# Patient Record
Sex: Male | Born: 2000 | Race: White | Hispanic: No | Marital: Single | Smoking: Never smoker
Health system: Southern US, Community
[De-identification: ages and names within clinical notes are randomized; demographics above are authoritative.]

## PROBLEM LIST (undated history)

## (undated) DIAGNOSIS — H539 Unspecified visual disturbance: Secondary | ICD-10-CM

## (undated) DIAGNOSIS — F909 Attention-deficit hyperactivity disorder, unspecified type: Secondary | ICD-10-CM

## (undated) DIAGNOSIS — J45909 Unspecified asthma, uncomplicated: Secondary | ICD-10-CM

## (undated) HISTORY — PX: NO PAST SURGERIES: SHX2092

---
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
## 2022-05-08 ENCOUNTER — Other Ambulatory Visit (HOSPITAL_COMMUNITY): Payer: Self-pay

## 2022-05-08 MED ORDER — LISDEXAMFETAMINE DIMESYLATE 20 MG PO CAPS
20.0000 mg | ORAL_CAPSULE | Freq: Every day | ORAL | 0 refills | Status: AC
  Filled 2022-05-08: qty 30, 30d supply, fill #0

## 2022-11-28 ENCOUNTER — Encounter (INDEPENDENT_AMBULATORY_CARE_PROVIDER_SITE_OTHER): Payer: Self-pay

## 2022-11-28 ENCOUNTER — Ambulatory Visit (INDEPENDENT_AMBULATORY_CARE_PROVIDER_SITE_OTHER): Payer: 59 | Admitting: Otolaryngology

## 2022-11-28 VITALS — Ht 70.0 in | Wt 180.0 lb

## 2022-11-28 DIAGNOSIS — J338 Other polyp of sinus: Secondary | ICD-10-CM | POA: Diagnosis not present

## 2022-11-28 DIAGNOSIS — J324 Chronic pansinusitis: Secondary | ICD-10-CM

## 2022-11-30 DIAGNOSIS — J324 Chronic pansinusitis: Secondary | ICD-10-CM | POA: Insufficient documentation

## 2022-11-30 DIAGNOSIS — J338 Other polyp of sinus: Secondary | ICD-10-CM | POA: Insufficient documentation

## 2022-11-30 NOTE — Progress Notes (Unsigned)
Patient ID: Manuel Ingram, male   DOB: 2001-01-01, 22 y.o.   MRN: [id number]  Follow up: Chronic rhinosinusitis and polyposis  HPI: The patient is a 22 year old male who returns today for his follow-up evaluation. The patient has a history of bilateral chronic rhinosinusitis and polyposis. He underwent bilateral endoscopic sinus surgery in 12/2018.  He was subsequently treated with Dupixent.  The patient noted significant improvement in his sinonasal polyps after treatment with Dupixent. The patient stopped using Dupixent in the middle of 2023.  Since stopping Dupixent, he has noted increasing nasal congestion, loss of sense of smell, and recurrent sinusitis.  He has also noted frontal headaches.  He denies any purulent drainage.   Exam: General: Communicates without difficulty, well nourished, no acute distress. Head: Normocephalic, no evidence injury, no tenderness, facial buttresses intact without stepoff. Face/sinus: No tenderness to palpation and percussion. Facial movement is normal and symmetric. Eyes: PERRL, EOMI. No scleral icterus, conjunctivae clear. Neuro: CN II exam reveals vision grossly intact.  No nystagmus at any point of gaze. Ears: Auricles well formed without lesions.  Ear canals are intact without mass or lesion.  No erythema or edema is appreciated.  The TMs are intact without fluid. Nose: External evaluation reveals normal support and skin without lesions.  Dorsum is intact.  Anterior rhinoscopy reveals congested mucosa over anterior aspect of inferior turbinates and intact septum.  Polypoid tissue is noted bilaterally.  Oral:  Oral cavity and oropharynx are intact, symmetric, without erythema or edema.  Mucosa is moist without lesions. Neck: Full range of motion without pain.  There is no significant lymphadenopathy.  No masses palpable.  Thyroid bed within normal limits to palpation.  Parotid glands and submandibular glands equal bilaterally without mass.  Trachea is midline. Neuro:   CN 2-12 grossly intact.    Assessment: 1.  Bilateral chronic rhinosinusitis, with bilateral recurrent sinonasal polyps. 2.  Both nasal cavities are significantly obstructed by the recurrent polyposis. 3.  His septum and turbinates are well-healed from his previous surgery.  Plan: 1.  The physical exam findings are reviewed with the patient and his mother. 2.  The patient was recently treated with systemic steroid without improvement in his symptoms. 3.  Based on the above findings, the patient is strongly encouraged to restart the Dupixent injections.  The patient will follow-up with Dr. Barnetta Chapel regarding the Dupixent treatment.

## 2023-06-29 ENCOUNTER — Ambulatory Visit (INDEPENDENT_AMBULATORY_CARE_PROVIDER_SITE_OTHER): Payer: 59 | Admitting: Otolaryngology

## 2023-11-16 ENCOUNTER — Ambulatory Visit (INDEPENDENT_AMBULATORY_CARE_PROVIDER_SITE_OTHER)

## 2023-11-16 ENCOUNTER — Encounter (INDEPENDENT_AMBULATORY_CARE_PROVIDER_SITE_OTHER): Payer: Self-pay

## 2023-11-16 VITALS — BP 119/80 | HR 80 | Ht 70.0 in | Wt 190.0 lb

## 2023-11-16 DIAGNOSIS — J338 Other polyp of sinus: Secondary | ICD-10-CM | POA: Diagnosis not present

## 2023-11-16 DIAGNOSIS — J36 Peritonsillar abscess: Secondary | ICD-10-CM

## 2023-11-16 DIAGNOSIS — J3501 Chronic tonsillitis: Secondary | ICD-10-CM

## 2023-11-16 DIAGNOSIS — J324 Chronic pansinusitis: Secondary | ICD-10-CM

## 2023-11-16 DIAGNOSIS — J310 Chronic rhinitis: Secondary | ICD-10-CM

## 2023-11-16 NOTE — Progress Notes (Signed)
 Dear Dr. Sybil, Here is my assessment for our mutual patient, Manuel Ingram. Thank you for allowing me the opportunity to care for your patient. Please do not hesitate to contact me should you have any other questions. Sincerely, Dr. Hadassah Parody  Otolaryngology Clinic Note Referring provider: Dr. Sybil HPI:  Discussed the use of AI scribe software for clinical note transcription with the patient, who gave verbal consent to proceed.  History of Present Illness Manuel Ingram is a 23 year old male with recurrent peritonsillar abscesses who presents for evaluation of tonsillectomy.  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 Determined this was due to caffeine consumption. Improved since avoiding caffeine.    Independent Review of Additional Tests or Records:  Referral note from [facility] (11/09/23): pt with left peritonsillar infection - given augmentin   G/C screen 10/13/23: negative   PMH/Meds/All/SocHx/FamHx/ROS:   Past Medical History:  Diagnosis Date   ADHD (attention  deficit hyperactivity disorder)    Asthma    Premature baby    30 weeks 3 days   Vision abnormalities    wears contacts     Past Surgical History:  Procedure Laterality Date   NO PAST SURGERIES      No family history on file.   Social Connections: Not on file     Current Outpatient Medications  Medication Instructions   albuterol  (PROVENTIL  HFA;VENTOLIN  HFA) 108 (90 BASE) MCG/ACT inhaler 2 puffs, Every 6 hours PRN   amoxicillin-clavulanate (AUGMENTIN) 875-125 MG tablet 1 tablet   lisdexamfetamine (VYVANSE ) 20 mg, Oral, Daily   Methylphenidate (COTEMPLA XR-ODT) 17.3 MG TBED Take by mouth.     Physical Exam:   BP 119/80 (BP Location: Right Arm, Patient Position: Sitting)   Pulse 80   Ht 5' 10 (1.778 m)   Wt 190 lb (86.2 kg)   SpO2 96%   BMI 27.26 kg/m   Salient findings:  CN II-XII intact Bilateral tonsils cryptic, 2+, no evidence of PTA No lesions of oral cavity/oropharynx; dentition native No obviously palpable neck masses/lymphadenopathy/thyromegaly No respiratory distress or stridor  Seprately Identifiable Procedures:  Prior to initiating any procedures, risks/benefits/alternatives were explained to the patient and verbal consent obtained. None  Impression & Plans:  Manuel Ingram is a 23 y.o. male with   Assessment & Plan Recurrent peritonsillar abscesses Recurrent peritonsillar abscesses treated with Augmentin, no drainage required.  Given that he has had two peritonsillar abscesses, tonsillectomy is indicated.  Discussed risks of recurrence and benefits of tonsillectomy. He is hesitant about tonsillectomy but reports his mother would prefer he do so. He is OK with either way.  Pt is willing to undergo tonsillectomy if that is what his mother prefers he do  - Set up phone visit with his mother to discuss tonsillectomy and provide more information. 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 Managed with Dupixent.  See below regarding exact medications prescribed this encounter including dosages and route: No orders of the defined types were placed in this encounter.  Thank you for allowing me the opportunity to care for your patient. Please do not hesitate to contact me should you have any other questions.  Sincerely, Hadassah Parody, MD Otolaryngologist (ENT), [facility] Phone: [phone] Fax: [phone]
